# Patient Record
Sex: Male | Born: 1975 | Race: White | Hispanic: No | Marital: Single | Smoking: Current every day smoker
Health system: Southern US, Community
[De-identification: ages and names within clinical notes are randomized; demographics above are authoritative.]

## PROBLEM LIST (undated history)

## (undated) HISTORY — PX: KNEE SURGERY: SHX244

---

## 2000-08-28 ENCOUNTER — Ambulatory Visit (HOSPITAL_BASED_OUTPATIENT_CLINIC_OR_DEPARTMENT_OTHER): Admission: RE | Admit: 2000-08-28 | Discharge: 2000-08-28 | Payer: Self-pay | Admitting: Orthopedic Surgery

## 2002-05-31 ENCOUNTER — Emergency Department (HOSPITAL_COMMUNITY): Admission: EM | Admit: 2002-05-31 | Discharge: 2002-05-31 | Payer: Self-pay | Admitting: Emergency Medicine

## 2002-07-10 ENCOUNTER — Emergency Department (HOSPITAL_COMMUNITY): Admission: EM | Admit: 2002-07-10 | Discharge: 2002-07-10 | Payer: Self-pay | Admitting: Emergency Medicine

## 2002-07-10 ENCOUNTER — Encounter: Payer: Self-pay | Admitting: Emergency Medicine

## 2003-04-29 ENCOUNTER — Emergency Department (HOSPITAL_COMMUNITY): Admission: EM | Admit: 2003-04-29 | Discharge: 2003-04-29 | Payer: Self-pay | Admitting: *Deleted

## 2003-04-30 ENCOUNTER — Emergency Department (HOSPITAL_COMMUNITY): Admission: EM | Admit: 2003-04-30 | Discharge: 2003-04-30 | Payer: Self-pay | Admitting: Internal Medicine

## 2004-09-15 ENCOUNTER — Emergency Department (HOSPITAL_COMMUNITY): Admission: EM | Admit: 2004-09-15 | Discharge: 2004-09-15 | Payer: Self-pay | Admitting: *Deleted

## 2004-12-20 ENCOUNTER — Emergency Department (HOSPITAL_COMMUNITY): Admission: EM | Admit: 2004-12-20 | Discharge: 2004-12-20 | Payer: Self-pay | Admitting: Family Medicine

## 2005-05-04 ENCOUNTER — Emergency Department (HOSPITAL_COMMUNITY): Admission: EM | Admit: 2005-05-04 | Discharge: 2005-05-04 | Payer: Self-pay | Admitting: Family Medicine

## 2005-05-24 ENCOUNTER — Emergency Department (HOSPITAL_COMMUNITY): Admission: EM | Admit: 2005-05-24 | Discharge: 2005-05-24 | Payer: Self-pay | Admitting: Family Medicine

## 2005-05-30 ENCOUNTER — Emergency Department (HOSPITAL_COMMUNITY): Admission: EM | Admit: 2005-05-30 | Discharge: 2005-05-30 | Payer: Self-pay | Admitting: Emergency Medicine

## 2005-07-03 ENCOUNTER — Emergency Department (HOSPITAL_COMMUNITY): Admission: EM | Admit: 2005-07-03 | Discharge: 2005-07-03 | Payer: Self-pay | Admitting: Emergency Medicine

## 2005-07-15 ENCOUNTER — Emergency Department (HOSPITAL_COMMUNITY): Admission: EM | Admit: 2005-07-15 | Discharge: 2005-07-15 | Payer: Self-pay | Admitting: Family Medicine

## 2005-10-25 ENCOUNTER — Emergency Department (HOSPITAL_COMMUNITY): Admission: EM | Admit: 2005-10-25 | Discharge: 2005-10-25 | Payer: Self-pay | Admitting: Emergency Medicine

## 2006-01-23 ENCOUNTER — Emergency Department (HOSPITAL_COMMUNITY): Admission: EM | Admit: 2006-01-23 | Discharge: 2006-01-23 | Payer: Self-pay | Admitting: Emergency Medicine

## 2007-01-13 ENCOUNTER — Emergency Department (HOSPITAL_COMMUNITY): Admission: EM | Admit: 2007-01-13 | Discharge: 2007-01-13 | Payer: Self-pay | Admitting: Emergency Medicine

## 2007-01-31 ENCOUNTER — Emergency Department (HOSPITAL_COMMUNITY): Admission: EM | Admit: 2007-01-31 | Discharge: 2007-01-31 | Payer: Self-pay | Admitting: Family Medicine

## 2007-04-22 ENCOUNTER — Emergency Department (HOSPITAL_COMMUNITY): Admission: EM | Admit: 2007-04-22 | Discharge: 2007-04-22 | Payer: Self-pay | Admitting: Emergency Medicine

## 2007-06-14 ENCOUNTER — Emergency Department (HOSPITAL_COMMUNITY): Admission: EM | Admit: 2007-06-14 | Discharge: 2007-06-14 | Payer: Self-pay | Admitting: Emergency Medicine

## 2007-06-17 ENCOUNTER — Emergency Department (HOSPITAL_COMMUNITY): Admission: EM | Admit: 2007-06-17 | Discharge: 2007-06-17 | Payer: Self-pay | Admitting: Emergency Medicine

## 2007-07-22 ENCOUNTER — Emergency Department (HOSPITAL_COMMUNITY): Admission: EM | Admit: 2007-07-22 | Discharge: 2007-07-22 | Payer: Self-pay | Admitting: Emergency Medicine

## 2007-08-02 ENCOUNTER — Emergency Department (HOSPITAL_COMMUNITY): Admission: EM | Admit: 2007-08-02 | Discharge: 2007-08-02 | Payer: Self-pay | Admitting: Emergency Medicine

## 2007-08-29 ENCOUNTER — Emergency Department (HOSPITAL_COMMUNITY): Admission: EM | Admit: 2007-08-29 | Discharge: 2007-08-29 | Payer: Self-pay | Admitting: *Deleted

## 2008-02-06 ENCOUNTER — Emergency Department (HOSPITAL_COMMUNITY): Admission: EM | Admit: 2008-02-06 | Discharge: 2008-02-06 | Payer: Self-pay | Admitting: Emergency Medicine

## 2008-04-19 ENCOUNTER — Emergency Department (HOSPITAL_COMMUNITY): Admission: EM | Admit: 2008-04-19 | Discharge: 2008-04-19 | Payer: Self-pay | Admitting: Emergency Medicine

## 2008-04-22 ENCOUNTER — Emergency Department (HOSPITAL_COMMUNITY): Admission: EM | Admit: 2008-04-22 | Discharge: 2008-04-22 | Payer: Self-pay | Admitting: Emergency Medicine

## 2008-04-24 ENCOUNTER — Emergency Department (HOSPITAL_COMMUNITY): Admission: EM | Admit: 2008-04-24 | Discharge: 2008-04-24 | Payer: Self-pay | Admitting: Emergency Medicine

## 2008-08-21 IMAGING — CR DG FOREARM 2V*L*
1 series · 1 of 1 positions shown · non-contrast
Comparison: None.

CLINICAL DATA: Chronic wrist and distal forearm pain, worse over the past 2-3
days. No recent injuries.

LEFT FOREARM - 2 VIEW  01/31/2007:

[view not recorded]
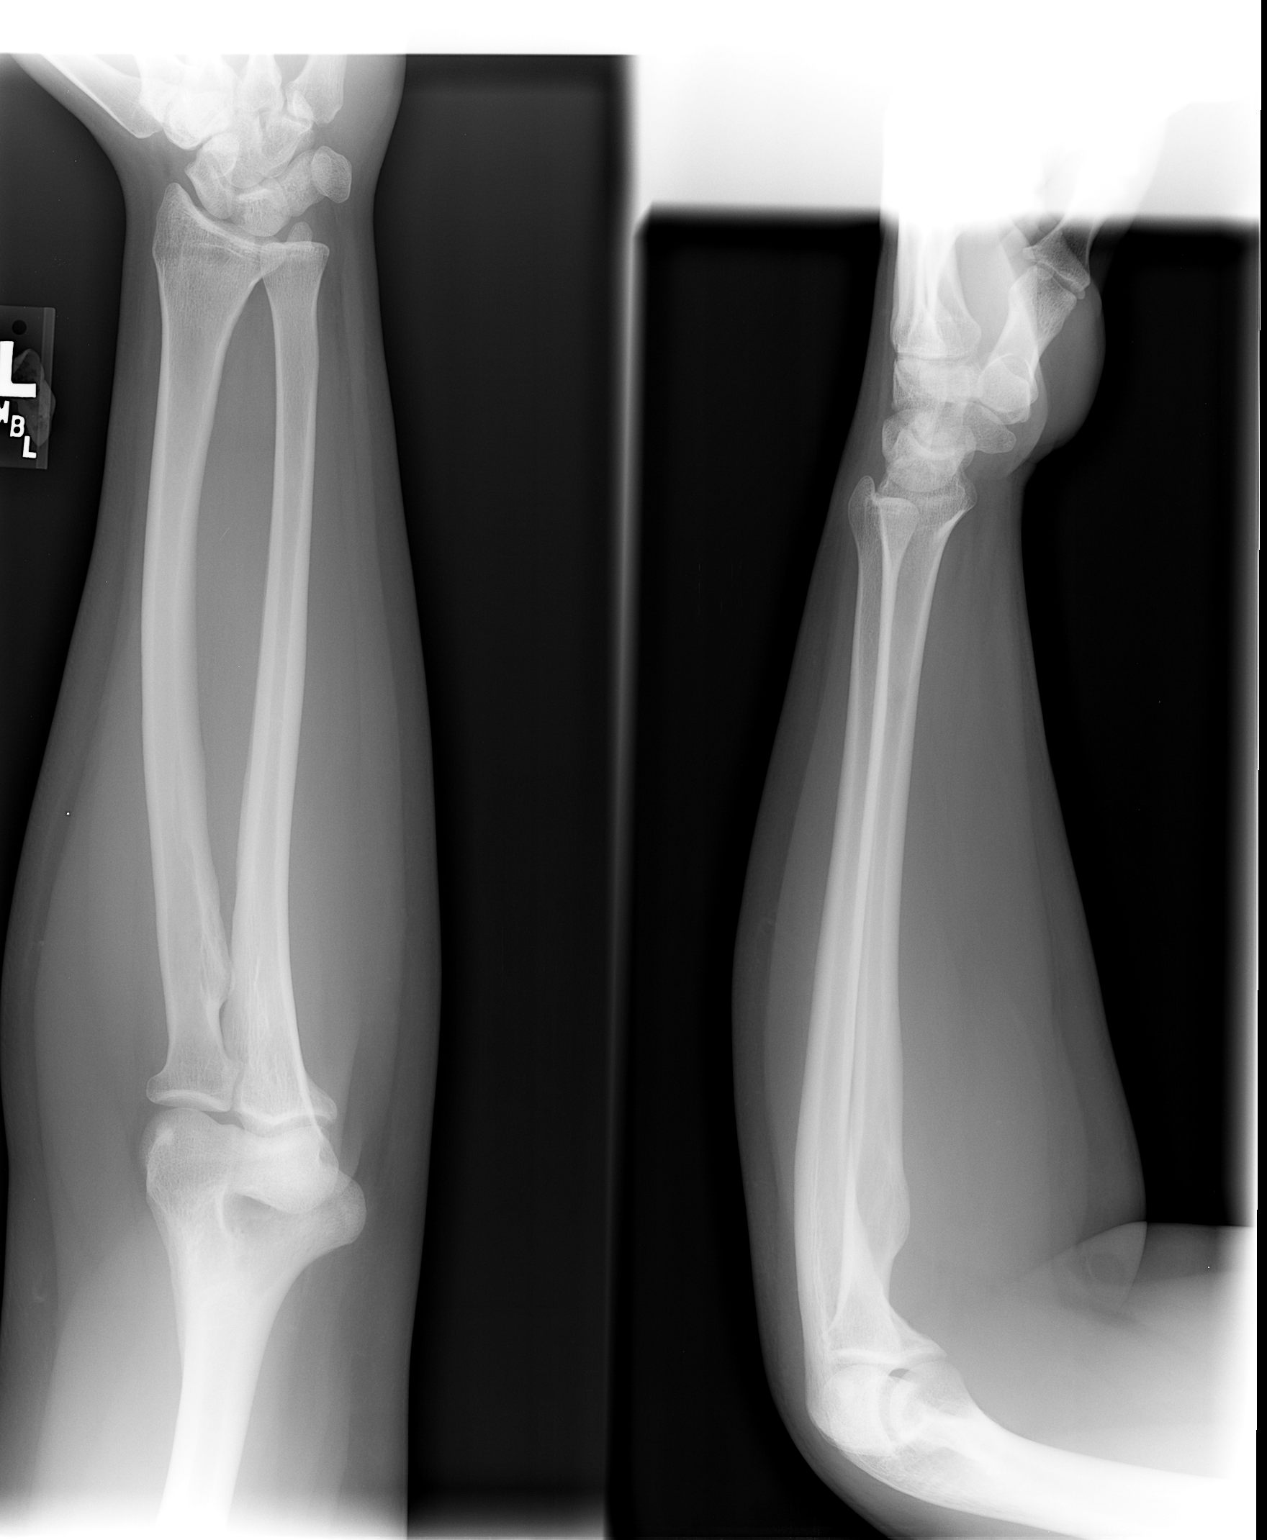

[1 of 1 positions shown; findings below may reference images not displayed]

FINDINGS: There is no evidence of acute fracture involving the radius or ulna.
There are no intrinsic osseous abnormalities. The visualized elbow joint and
wrist joint appear intact. Incidental note of bone island in the lateral humeral
condyle.
IMPRESSION: Normal examination.

## 2011-03-11 NOTE — Op Note (Signed)
[facility]  Patient:    James Fitzgerald, James Fitzgerald                        MRN: [id number] Proc. Date: 08/28/00 Adm. Date:  [id number] Attending:  Burnard Bunting                           Operative Report  PREOPERATIVE DIAGNOSIS:  Right knee pain.  POSTOPERATIVE DIAGNOSIS:  Right knee plica and partial anterior cruciate ligament tear.  OPERATION:  Right knee diagnostic and operative arthroscopy with resection of medial plica.  SURGEON:  Cammy Copa, M.D.  ANESTHESIA:  General endotracheal.  ESTIMATED BLOOD LOSS:  None.  DRAINS:  None.  TOURNIQUET TIME:  39 minutes at 300 mmHg  INDICATIONS:  The patient is a 35 year old patient who has a three month history of right knee pain.  The patient injured his right knee approximately three months ago playing basketball and has had focal medial sided knee pain since that time.  He denies any episodes of instability.  Presents now for operative management.  OPERATIVE FINDINGS: 1. Examination under anesthesia:  Range of motion 0 to 125 degrees.  The    patient had about 300 mm of anterior Lachman on the right with distant    point compared 2 mm anterior translation on the left.  Pivot shift    was negative on the right.  No posterior lateral instability.  The LCL    and MCL are stable to varus and valgus stress at 0 to 30 degrees.    PCL also negative with tibial step-off maintained with posterior    Drawer and knee flexed to 90 degrees. 2. Diagnostic and operative arthroscopy:    a. Intact patella femoral joint and suprapatellar pouch.    b. Large medial plica with mild changes on the medial aspect on the       medial femoral condyle.    c. Intact medial compartment with intact meniscus and femoral and       tibial articular cartilage.    d. Partial ACL tear with ACL laxity observed but the tear is less than 50%       of the width of the ACL.    e. Intact lateral compartment.  Intact meniscus and  articular cartilage.    f. No loose bodies in the medial or lateral gutter.  DESCRIPTION OF PROCEDURE:  The patient was brought to the operating room where general endotracheal anesthesia was induced.  Preoperative IV antibiotics were administered.  The right leg was prepped with Duraprep solution and draped in a sterile manner.  Proximal thigh tourniquet was used.  The left was elevated and exsanguinated with the Esmarch.  The tourniquet was inflated.  Prior to prepping and draping, the knee was examined under anesthesia.  After the leg was elevated and exsanguinated with the Esmarch wrap, the tourniquet was inflated.  Anatomy of the right knee was identified including the tibial tubercle, the medial and lateral borders of the patellar tendon, the inferior pole of the patella, and the joint line medially and laterally.  Anterior inferior lateral portal was made.  After localization with the spinal needle, the scope was inserted and the inferomedial portal was then established under direct visualization with the spinal needle.  Medial compartment was inspected.  The articular cartilage was intact.  It was probed and found to be stable in extension and flexion.  The meniscus was then probed and also was found to be intact.  It was then viewed from the back through the intercondylar notch and was found to be intact.  The intercondylar notch was inspected and the PCL was intact.  The ACL had some laxity and looked like it had had a partial tear but it was overall structurally intact.  The tear left approximately 70% of the fibers intact.  Lateral compartment was entered and the meniscus and articular cartilage were found to be stable and intact.  Medial and lateral gutters were viewed and they were found to be intact.  The patella femoral articulation was also intact with no evidence of arthritis.  There was a large thickened plica on the medial aspect of the knee.  This was frayed in one  location.  There was some mild correlating changes on the medial femoral condyle.  The plica was resected with the shaver.  The knee was then irrigated and the instruments were removed.  Portals were closed with 3-0 nylon suture.  The knee was then placed in a wrap and an ice pack.  Tourniquet was released after 39 minutes. The patient was transferred to the recovery room in stable condition.  Pedal pulse was palpable.  The patient tolerated the procedure well without immediate complications. DD:  08/28/00 TD:  08/29/00 Job: [id number]

## 2011-07-21 LAB — BASIC METABOLIC PANEL
BUN: 7
BUN: 7
CO2: 30
CO2: 27
Calcium: 9.4
Calcium: 9.1
Chloride: 106
Chloride: 106
Creatinine, Ser: 0.82
Creatinine, Ser: 0.88
GFR calc Af Amer: >60
GFR calc Af Amer: >60
GFR calc non Af Amer: >60
GFR calc non Af Amer: >60
Glucose, Bld: 101 — ABNORMAL HIGH
Glucose, Bld: 115 — ABNORMAL HIGH
Potassium: 4.8
Potassium: 3.3 — ABNORMAL LOW
Sodium: 140
Sodium: 143

## 2011-07-21 LAB — DIFFERENTIAL
Basophils Absolute: 0
Basophils Relative: 0
Eosinophils Absolute: 0.3
Eosinophils Relative: 2
Lymphocytes Relative: 27
Lymphs Abs: 3
Monocytes Absolute: 0.9
Monocytes Relative: 8
Neutro Abs: 6.9
Neutrophils Relative %: 62

## 2011-07-21 LAB — CBC
HCT: 45.1
HCT: 43.4
Hemoglobin: 15.4
Hemoglobin: 14.9
MCHC: 34.1
MCHC: 34.3
MCV: 95.7
MCV: 94.7
Platelets: 258
Platelets: 274
RBC: 4.71
RBC: 4.58
RDW: 13.4
RDW: 13.1
WBC: 11.1 — ABNORMAL HIGH
WBC: 11.4 — ABNORMAL HIGH

## 2011-07-21 LAB — RAPID URINE DRUG SCREEN, HOSP PERFORMED
Amphetamines: NOT DETECTED
Amphetamines: NOT DETECTED
Barbiturates: NOT DETECTED
Barbiturates: NOT DETECTED
Benzodiazepines: NOT DETECTED
Benzodiazepines: NOT DETECTED
Cocaine: NOT DETECTED
Cocaine: NOT DETECTED
Opiates: NOT DETECTED
Opiates: NOT DETECTED
Tetrahydrocannabinol: NOT DETECTED
Tetrahydrocannabinol: NOT DETECTED

## 2011-07-21 LAB — ETHANOL
Alcohol, Ethyl (B): <5
Alcohol, Ethyl (B): 208 — ABNORMAL HIGH

## 2011-08-02 LAB — RAPID URINE DRUG SCREEN, HOSP PERFORMED
Amphetamines: NOT DETECTED
Barbiturates: NOT DETECTED
Benzodiazepines: NOT DETECTED
Cocaine: NOT DETECTED
Opiates: NOT DETECTED
Tetrahydrocannabinol: NOT DETECTED

## 2011-08-02 LAB — URINALYSIS, ROUTINE W REFLEX MICROSCOPIC
Bilirubin Urine: NEGATIVE
Glucose, UA: NEGATIVE
Hgb urine dipstick: NEGATIVE
Ketones, ur: NEGATIVE
Nitrite: NEGATIVE
Protein, ur: NEGATIVE
Specific Gravity, Urine: 1.025
Urobilinogen, UA: 4 — ABNORMAL HIGH
pH: 6.5

## 2011-08-02 LAB — BASIC METABOLIC PANEL
BUN: 4 — ABNORMAL LOW
CO2: 30
Calcium: 9.7
Chloride: 102
Creatinine, Ser: 0.87
GFR calc Af Amer: >60
GFR calc non Af Amer: >60
Glucose, Bld: 102 — ABNORMAL HIGH
Potassium: 3.2 — ABNORMAL LOW
Sodium: 139

## 2011-08-02 LAB — DIFFERENTIAL
Basophils Absolute: 0
Basophils Relative: 0
Eosinophils Absolute: 0
Eosinophils Relative: 0
Lymphocytes Relative: 9 — ABNORMAL LOW
Lymphs Abs: 1.1
Monocytes Absolute: 1 — ABNORMAL HIGH
Monocytes Relative: 8
Neutro Abs: 10.4 — ABNORMAL HIGH
Neutrophils Relative %: 83 — ABNORMAL HIGH

## 2011-08-02 LAB — CBC
HCT: 44.3
Hemoglobin: 15.4
MCHC: 34.7
MCV: 93.4
Platelets: 233
RBC: 4.75
RDW: 13
WBC: 12.5 — ABNORMAL HIGH

## 2011-08-02 LAB — ETHANOL: Alcohol, Ethyl (B): <5

## 2011-08-04 LAB — CBC
HCT: 45.5
Hemoglobin: 15.6
MCHC: 34.3
MCV: 94.6
RBC: 4.81
WBC: 12.8 — ABNORMAL HIGH

## 2011-08-04 LAB — BASIC METABOLIC PANEL
CO2: 30
Chloride: 107
Creatinine, Ser: 0.82
GFR calc Af Amer: >60
Potassium: 3.7
Sodium: 139

## 2011-08-04 LAB — ETHANOL: Alcohol, Ethyl (B): <5

## 2011-08-04 LAB — RAPID URINE DRUG SCREEN, HOSP PERFORMED
Barbiturates: NOT DETECTED
Benzodiazepines: NOT DETECTED

## 2011-08-04 LAB — DIFFERENTIAL
Basophils Relative: 0
Eosinophils Absolute: 0.3
Lymphs Abs: 2.9
Monocytes Absolute: 1.1 — ABNORMAL HIGH
Monocytes Relative: 8
Neutrophils Relative %: 66

## 2011-08-05 LAB — COMPREHENSIVE METABOLIC PANEL
CO2: 30
Calcium: 8.9
Creatinine, Ser: 0.85
GFR calc Af Amer: >60
GFR calc non Af Amer: >60
Glucose, Bld: 100 — ABNORMAL HIGH
Total Protein: 6

## 2011-08-05 LAB — LIPASE, BLOOD: Lipase: 20

## 2011-08-05 LAB — DIFFERENTIAL
Lymphocytes Relative: 26
Lymphs Abs: 2.8
Monocytes Relative: 8
Neutrophils Relative %: 63

## 2011-08-05 LAB — CBC
MCHC: 34.2
MCV: 95.1
RBC: 4.62
RDW: 13.4

## 2014-04-02 ENCOUNTER — Emergency Department (HOSPITAL_COMMUNITY)
Admission: EM | Admit: 2014-04-02 | Discharge: 2014-04-02 | Disposition: A | Discharge status: Home / Self Care | Payer: Self-pay | Attending: Emergency Medicine | Admitting: Emergency Medicine

## 2014-04-02 ENCOUNTER — Encounter (HOSPITAL_COMMUNITY): Payer: Self-pay | Admitting: Emergency Medicine

## 2014-04-02 ENCOUNTER — Emergency Department (HOSPITAL_COMMUNITY): Payer: Self-pay

## 2014-04-02 DIAGNOSIS — Z9889 Other specified postprocedural states: Secondary | ICD-10-CM | POA: Insufficient documentation

## 2014-04-02 DIAGNOSIS — IMO0002 Reserved for concepts with insufficient information to code with codable children: Secondary | ICD-10-CM | POA: Insufficient documentation

## 2014-04-02 DIAGNOSIS — X500XXA Overexertion from strenuous movement or load, initial encounter: Secondary | ICD-10-CM | POA: Insufficient documentation

## 2014-04-02 DIAGNOSIS — Y9389 Activity, other specified: Secondary | ICD-10-CM | POA: Insufficient documentation

## 2014-04-02 DIAGNOSIS — F17200 Nicotine dependence, unspecified, uncomplicated: Secondary | ICD-10-CM | POA: Insufficient documentation

## 2014-04-02 DIAGNOSIS — S86919A Strain of unspecified muscle(s) and tendon(s) at lower leg level, unspecified leg, initial encounter: Secondary | ICD-10-CM

## 2014-04-02 DIAGNOSIS — Y929 Unspecified place or not applicable: Secondary | ICD-10-CM | POA: Insufficient documentation

## 2014-04-02 MED ORDER — HYDROCODONE-ACETAMINOPHEN 5-325 MG PO TABS: 1.0000 | ORAL_TABLET | ORAL | Status: AC | PRN

## 2014-04-02 MED ORDER — IBUPROFEN 800 MG PO TABS: 800.0000 mg | ORAL_TABLET | Freq: Three times a day (TID) | ORAL | Status: AC

## 2014-04-02 MED ORDER — HYDROCODONE-ACETAMINOPHEN 5-325 MG PO TABS
1.0000 | ORAL_TABLET | Freq: Once | ORAL | Status: AC
  Administered 2014-04-02: 1 via ORAL
  Filled 2014-04-02: qty 1

## 2014-04-02 NOTE — ED Notes (Signed)
ED PA in to assess patient

## 2014-04-02 NOTE — ED Notes (Signed)
Pt states he twisted left knee yesterday and c/o pain that radiates lower leg, pt ambulated to triage room without difficultly

## 2014-04-02 NOTE — ED Provider Notes (Signed)
Medical screening examination/treatment/procedure(s) were performed by non-physician practitioner and as supervising physician I was immediately available for consultation/collaboration.   EKG Interpretation None        Joya Gaskins, MD 04/02/14 2157

## 2014-04-02 NOTE — Discharge Instructions (Signed)
Knee Sprain A knee sprain is a tear in one of the strong, fibrous tissues that connect the bones (ligaments) in your knee. The severity of the sprain depends on how much of the ligament is torn. The tear can be either partial or complete. CAUSES  Often, sprains are a result of a fall or injury. The force of the impact causes the fibers of your ligament to stretch too much. This excess tension causes the fibers of your ligament to tear. SIGNS AND SYMPTOMS  You may have some loss of motion in your knee. Other symptoms include:  Bruising.  Pain in the knee area.  Tenderness of the knee to the touch.  Swelling. DIAGNOSIS  To diagnose a knee sprain, your health care provider will physically examine your knee. Your health care provider may also suggest an X-Langenberg exam of your knee to make sure no bones are broken. TREATMENT  If your ligament is only partially torn, treatment usually involves keeping the knee in a fixed position (immobilization) or bracing your knee for activities that require movement for several weeks. To do this, your health care provider will apply a bandage, cast, or splint to keep your knee from moving and to support your knee during movement until it heals. For a partially torn ligament, the healing process usually takes 4 6 weeks. If your ligament is completely torn, depending on which ligament it is, you may need surgery to reconnect the ligament to the bone or reconstruct it. After surgery, a cast or splint may be applied and will need to stay on your knee for 4 6 weeks while your ligament heals. HOME CARE INSTRUCTIONS  Keep your injured knee elevated to decrease swelling.  To ease pain and swelling, apply ice to the injured area:  Put ice in a plastic bag.  Place a towel between your skin and the bag.  Leave the ice on for 20 minutes, 2 3 times a day.  Only take medicine for pain as directed by your health care provider.  Do not leave your knee unprotected until  pain and stiffness go away (usually 4 6 weeks).  If you have a cast or splint, do not allow it to get wet. If you have been instructed not to remove it, cover it with a plastic bag when you shower or bathe. Do not swim.  Your health care provider may suggest exercises for you to do during your recovery to prevent or limit permanent weakness and stiffness. SEEK IMMEDIATE MEDICAL CARE IF:  Your cast or splint becomes damaged.  Your pain becomes worse.  You have significant pain, swelling, or numbness below the cast or splint. MAKE SURE YOU:  Understand these instructions.  Will watch your condition.  Will get help right away if you are not doing well or get worse. Document Released: 10/10/2005 Document Revised: 07/31/2013 Document Reviewed: 05/22/2013 [facility] Patient Information 2014 [employer]en[employer] Saenz.  Cryotherapy Cryotherapy means treatment with cold. Ice or gel packs can be used to reduce both pain and swelling. Ice is the most helpful within the first 24 to 48 hours after an injury or flareup from overusing a muscle or joint. Sprains, strains, spasms, burning pain, shooting pain, and aches can all be eased with ice. Ice can also be used when recovering from surgery. Ice is effective, has very few side effects, and is safe for most people to use. PRECAUTIONS  Ice is not a safe treatment option for people with:  Raynaud's phenomenon. This is a condition  affecting small blood vessels in the extremities. Exposure to cold may cause your problems to return.  Cold hypersensitivity. There are many forms of cold hypersensitivity, including:  Cold urticaria. Red, itchy hives appear on the skin when the tissues begin to warm after being iced.  Cold erythema. This is a red, itchy rash caused by exposure to cold.  Cold hemoglobinuria. Red blood cells break down when the tissues begin to warm after being iced. The hemoglobin that carry oxygen are passed into the urine because they cannot  combine with blood proteins fast enough.  Numbness or altered sensitivity in the area being iced. If you have any of the following conditions, do not use ice until you have discussed cryotherapy with your caregiver:  Heart conditions, such as arrhythmia, angina, or chronic heart disease.  High blood pressure.  Healing wounds or open skin in the area being iced.  Current infections.  Rheumatoid arthritis.  Poor circulation.  Diabetes. Ice slows the blood flow in the region it is applied. This is beneficial when trying to stop inflamed tissues from spreading irritating chemicals to surrounding tissues. However, if you expose your skin to cold temperatures for too long or without the proper protection, you can damage your skin or nerves. Watch for signs of skin damage due to cold. HOME CARE INSTRUCTIONS Follow these tips to use ice and cold packs safely.  Place a dry or damp towel between the ice and skin. A damp towel will cool the skin more quickly, so you may need to shorten the time that the ice is used.  For a more rapid response, add gentle compression to the ice.  Ice for no more than 10 to 20 minutes at a time. The bonier the area you are icing, the less time it will take to get the benefits of ice.  Check your skin after 5 minutes to make sure there are no signs of a poor response to cold or skin damage.  Rest 20 minutes or more in between uses.  Once your skin is numb, you can end your treatment. You can test numbness by very lightly touching your skin. The touch should be so light that you do not see the skin dimple from the pressure of your fingertip. When using ice, most people will feel these normal sensations in this order: cold, burning, aching, and numbness.  Do not use ice on someone who cannot communicate their responses to pain, such as small children or people with dementia. HOW TO MAKE AN ICE PACK Ice packs are the most common way to use ice therapy. Other  methods include ice massage, ice baths, and cryo-sprays. Muscle creams that cause a cold, tingly feeling do not offer the same benefits that ice offers and should not be used as a substitute unless recommended by your caregiver. To make an ice pack, do one of the following:  Place crushed ice or a bag of frozen vegetables in a sealable plastic bag. Squeeze out the excess air. Place this bag inside another plastic bag. Slide the bag into a pillowcase or place a damp towel between your skin and the bag.  Mix 3 parts water with 1 part rubbing alcohol. Freeze the mixture in a sealable plastic bag. When you remove the mixture from the freezer, it will be slushy. Squeeze out the excess air. Place this bag inside another plastic bag. Slide the bag into a pillowcase or place a damp towel between your skin and the bag. SEEK MEDICAL  CARE IF:  You develop white spots on your skin. This may give the skin a blotchy (mottled) appearance.  Your skin turns blue or pale.  Your skin becomes waxy or hard.  Your swelling gets worse. MAKE SURE YOU:   Understand these instructions.  Will watch your condition.  Will get help right away if you are not doing well or get worse. Document Released: 06/06/2011 Document Revised: 01/02/2012 Document Reviewed: 06/06/2011 [facility] Patient Information 2014 [place], [place].

## 2014-04-02 NOTE — ED Provider Notes (Signed)
CSN: [id number]     Arrival date & time 04/02/14  1709 History   First MD Initiated Contact with Patient 04/02/14 1736     Chief Complaint  Patient presents with  . Knee Injury     (Consider location/radiation/quality/duration/timing/severity/associated sxs/prior Treatment) Patient is a 38 y.o. male presenting with knee pain. The history is provided by the patient. No language interpreter was used.  Knee Pain Location:  Knee Injury: yes   Knee location:  L knee Associated symptoms: no fever   Associated symptoms comment:  He complains of pain since yesterday after twisting injury that led to fall. Exact mechanism is unclear. He reports pain to inferior knee that radiates into medial calf. No other injury.   History reviewed. No pertinent past medical history. Past Surgical History  Procedure Laterality Date  . Knee surgery     History reviewed. No pertinent family history. History  Substance Use Topics  . Smoking status: Current Every Day Smoker    Types: Cigarettes  . Smokeless tobacco: Not on file  . Alcohol Use: No    Review of Systems  Constitutional: Negative for fever and chills.  Musculoskeletal:       See HPI  Skin: Negative.  Negative for wound.  Neurological: Negative.  Negative for numbness.      Allergies  Sulfur  Home Medications   Prior to Admission medications   Not on File   BP 123/69  Pulse 78  Temp(Src) 97.4 F (36.3 C) (Oral)  Resp 18  Ht 5' 8"  (1.727 m)  Wt 215 lb (97.523 kg)  BMI 32.70 kg/m2  SpO2 98% Physical Exam  Constitutional: He is oriented to person, place, and time. He appears well-developed and well-nourished.  Neck: Normal range of motion.  Pulmonary/Chest: Effort normal.  Musculoskeletal: Normal range of motion.  Left knee minimally swollen. Tender anteromedial inferior knee. No discoloration or bony deformity. Joint stable. FROM with pain.  Neurological: He is alert and oriented to person, place, and time.  Skin: Skin  is warm and dry.  Psychiatric: He has a normal mood and affect.    ED Course  Procedures (including critical care time) Labs Review Labs Reviewed - No data to display  Imaging Review No results found.   EKG Interpretation None     Results for orders placed during the hospital encounter of 04/24/08  ETHANOL      Result Value Ref Range   Alcohol, Ethyl (B)   (*)    Value: 208            LOWEST DETECTABLE LIMIT FOR     SERUM ALCOHOL IS 11 mg/dL     FOR MEDICAL PURPOSES ONLY  BASIC METABOLIC PANEL      Result Value Ref Range   Sodium 143     Potassium 3.3 DELTA CHECK NOTED (*)    Chloride 106     CO2 27     Glucose, Bld 115 (*)    BUN 7     Creatinine, Ser 0.88     Calcium 9.1     GFR calc non Af Amer >60     GFR calc Af Amer       Value: >60            The eGFR has been calculated     using the MDRD equation.     This calculation has not been     validated in all clinical  CBC      Result  Value Ref Range   WBC 11.4 (*)    RBC 4.58     Hemoglobin 14.9     HCT 43.4     MCV 94.7     MCHC 34.3     RDW 13.1     Platelets 274    URINE RAPID DRUG SCREEN (HOSP PERFORMED)      Result Value Ref Range   Opiates NONE DETECTED     Cocaine NONE DETECTED     Benzodiazepines NONE DETECTED     Amphetamines NONE DETECTED     Tetrahydrocannabinol NONE DETECTED     Barbiturates       Value: NONE DETECTED            DRUG SCREEN FOR MEDICAL PURPOSES     ONLY.  IF CONFIRMATION IS NEEDED     FOR ANY PURPOSE, NOTIFY LAB     WITHIN 5 DAYS.    MDM   Final diagnoses:  None    1. Left knee sprain  Negative x ray for fracture. Suspect knee sprain/strain injury. Supportive care.    Dewaine Oats, PA-C 04/02/14 [phone]
